# Patient Record
Sex: Female | Born: 2000 | Race: White | Hispanic: No | Marital: Single | Smoking: Never smoker
Health system: Southern US, Community
[De-identification: ages and names within clinical notes are randomized; demographics above are authoritative.]

## PROBLEM LIST (undated history)

## (undated) DIAGNOSIS — K219 Gastro-esophageal reflux disease without esophagitis: Secondary | ICD-10-CM

## (undated) DIAGNOSIS — F419 Anxiety disorder, unspecified: Secondary | ICD-10-CM

## (undated) DIAGNOSIS — K625 Hemorrhage of anus and rectum: Secondary | ICD-10-CM

## (undated) DIAGNOSIS — Z8669 Personal history of other diseases of the nervous system and sense organs: Secondary | ICD-10-CM

## (undated) DIAGNOSIS — I10 Essential (primary) hypertension: Secondary | ICD-10-CM

## (undated) DIAGNOSIS — R110 Nausea: Secondary | ICD-10-CM

## (undated) DIAGNOSIS — R450 Nervousness: Secondary | ICD-10-CM

## (undated) DIAGNOSIS — F429 Obsessive-compulsive disorder, unspecified: Secondary | ICD-10-CM

## (undated) DIAGNOSIS — R42 Dizziness and giddiness: Secondary | ICD-10-CM

## (undated) HISTORY — DX: Essential (primary) hypertension: I10

## (undated) HISTORY — DX: Hemorrhage of anus and rectum: K62.5

## (undated) HISTORY — DX: Gastro-esophageal reflux disease without esophagitis: K21.9

## (undated) HISTORY — DX: Obsessive-compulsive disorder, unspecified: F42.9

## (undated) HISTORY — DX: Dizziness and giddiness: R42

## (undated) HISTORY — DX: Nervousness: R45.0

## (undated) HISTORY — PX: WISDOM TOOTH EXTRACTION: SHX21

## (undated) HISTORY — PX: TARSAL COALITION EXCISION: SUR968

## (undated) HISTORY — DX: Anxiety disorder, unspecified: F41.9

## (undated) HISTORY — DX: Nausea: R11.0

---
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
## 2019-02-27 DIAGNOSIS — G8918 Other acute postprocedural pain: Secondary | ICD-10-CM | POA: Diagnosis not present

## 2019-02-27 DIAGNOSIS — M25571 Pain in right ankle and joints of right foot: Secondary | ICD-10-CM | POA: Diagnosis not present

## 2019-02-27 DIAGNOSIS — Q6689 Other  specified congenital deformities of feet: Secondary | ICD-10-CM | POA: Diagnosis not present

## 2019-09-25 DIAGNOSIS — Z01419 Encounter for gynecological examination (general) (routine) without abnormal findings: Secondary | ICD-10-CM | POA: Diagnosis not present

## 2019-09-25 DIAGNOSIS — N760 Acute vaginitis: Secondary | ICD-10-CM | POA: Diagnosis not present

## 2019-09-25 DIAGNOSIS — Z6821 Body mass index (BMI) 21.0-21.9, adult: Secondary | ICD-10-CM | POA: Diagnosis not present

## 2019-09-27 DIAGNOSIS — Z111 Encounter for screening for respiratory tuberculosis: Secondary | ICD-10-CM | POA: Diagnosis not present

## 2019-09-27 DIAGNOSIS — R030 Elevated blood-pressure reading, without diagnosis of hypertension: Secondary | ICD-10-CM | POA: Diagnosis not present

## 2019-09-27 DIAGNOSIS — Z23 Encounter for immunization: Secondary | ICD-10-CM | POA: Diagnosis not present

## 2019-09-27 DIAGNOSIS — R319 Hematuria, unspecified: Secondary | ICD-10-CM | POA: Diagnosis not present

## 2019-09-27 DIAGNOSIS — Z68.41 Body mass index (BMI) pediatric, 5th percentile to less than 85th percentile for age: Secondary | ICD-10-CM | POA: Diagnosis not present

## 2019-09-27 DIAGNOSIS — Z1331 Encounter for screening for depression: Secondary | ICD-10-CM | POA: Diagnosis not present

## 2019-09-27 DIAGNOSIS — Z0000 Encounter for general adult medical examination without abnormal findings: Secondary | ICD-10-CM | POA: Diagnosis not present

## 2019-09-27 DIAGNOSIS — Z713 Dietary counseling and surveillance: Secondary | ICD-10-CM | POA: Diagnosis not present

## 2019-10-10 DIAGNOSIS — R319 Hematuria, unspecified: Secondary | ICD-10-CM | POA: Diagnosis not present

## 2019-10-10 DIAGNOSIS — R300 Dysuria: Secondary | ICD-10-CM | POA: Diagnosis not present

## 2019-10-16 DIAGNOSIS — R799 Abnormal finding of blood chemistry, unspecified: Secondary | ICD-10-CM | POA: Diagnosis not present

## 2019-10-16 DIAGNOSIS — R319 Hematuria, unspecified: Secondary | ICD-10-CM | POA: Diagnosis not present

## 2019-10-24 DIAGNOSIS — Z20828 Contact with and (suspected) exposure to other viral communicable diseases: Secondary | ICD-10-CM | POA: Diagnosis not present

## 2019-10-24 DIAGNOSIS — Z03818 Encounter for observation for suspected exposure to other biological agents ruled out: Secondary | ICD-10-CM | POA: Diagnosis not present

## 2019-11-15 DIAGNOSIS — Z23 Encounter for immunization: Secondary | ICD-10-CM | POA: Diagnosis not present

## 2019-12-06 DIAGNOSIS — Z23 Encounter for immunization: Secondary | ICD-10-CM | POA: Diagnosis not present

## 2020-06-13 ENCOUNTER — Ambulatory Visit: Payer: BC Managed Care – PPO | Admitting: Podiatry

## 2020-06-13 ENCOUNTER — Encounter: Payer: Self-pay | Admitting: Podiatry

## 2020-06-13 ENCOUNTER — Other Ambulatory Visit: Payer: Self-pay

## 2020-06-13 DIAGNOSIS — B070 Plantar wart: Secondary | ICD-10-CM

## 2020-06-16 NOTE — Progress Notes (Signed)
Subjective:   Patient ID: Kaitlin Terry, female   DOB: 19 y.o.   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  States that it seemed to do well before and she does not want anything cut out currently and presents with mother   Review of Systems  All other systems reviewed and are negative.       Objective:  Physical Exam Vitals and nursing note reviewed.  Constitutional:      Appearance: She is well-developed.  Pulmonary:     Effort: Pulmonary effort is normal.  Musculoskeletal:        General: Normal range of motion.  Skin:    General: Skin is warm.  Neurological:     Mental Status: She is alert.     Neurovascular status intact muscle strength found to be adequate range of motion within normal limits.  Patient is found to have a small mass on the plantar medial right heel that is painful when palpated and makes walking difficult at times.  It is localized to this area and measures about 8 mm x 1 cm     Assessment:  Verruca plantaris plantar aspect right     Plan:  H&P condition reviewed and today went ahead using sharp sterile instrumentation and lesion I then found a small area of verrucous tissue I applied chemical agent response with sterile dressing and patient will leave this on for 1 day and I explained what to do if any blistering or other pathology were to occur.  Reappoint as needed

## 2020-09-05 DIAGNOSIS — R45851 Suicidal ideations: Secondary | ICD-10-CM | POA: Diagnosis not present

## 2020-09-05 DIAGNOSIS — K219 Gastro-esophageal reflux disease without esophagitis: Secondary | ICD-10-CM | POA: Diagnosis not present

## 2020-09-05 DIAGNOSIS — Z6821 Body mass index (BMI) 21.0-21.9, adult: Secondary | ICD-10-CM | POA: Diagnosis not present

## 2020-09-05 DIAGNOSIS — R452 Unhappiness: Secondary | ICD-10-CM | POA: Diagnosis not present

## 2020-09-05 DIAGNOSIS — Z1331 Encounter for screening for depression: Secondary | ICD-10-CM | POA: Diagnosis not present

## 2020-09-05 DIAGNOSIS — R450 Nervousness: Secondary | ICD-10-CM | POA: Diagnosis not present

## 2020-09-05 DIAGNOSIS — Z23 Encounter for immunization: Secondary | ICD-10-CM | POA: Diagnosis not present

## 2020-09-05 DIAGNOSIS — R454 Irritability and anger: Secondary | ICD-10-CM | POA: Diagnosis not present

## 2020-10-21 DIAGNOSIS — R112 Nausea with vomiting, unspecified: Secondary | ICD-10-CM | POA: Diagnosis not present

## 2020-10-21 DIAGNOSIS — R6881 Early satiety: Secondary | ICD-10-CM | POA: Diagnosis not present

## 2020-10-21 DIAGNOSIS — R1310 Dysphagia, unspecified: Secondary | ICD-10-CM | POA: Diagnosis not present

## 2020-10-21 DIAGNOSIS — K219 Gastro-esophageal reflux disease without esophagitis: Secondary | ICD-10-CM | POA: Diagnosis not present

## 2020-11-10 DIAGNOSIS — Z1331 Encounter for screening for depression: Secondary | ICD-10-CM | POA: Diagnosis not present

## 2020-11-10 DIAGNOSIS — Z113 Encounter for screening for infections with a predominantly sexual mode of transmission: Secondary | ICD-10-CM | POA: Diagnosis not present

## 2020-11-10 DIAGNOSIS — R030 Elevated blood-pressure reading, without diagnosis of hypertension: Secondary | ICD-10-CM | POA: Diagnosis not present

## 2020-11-10 DIAGNOSIS — Z0000 Encounter for general adult medical examination without abnormal findings: Secondary | ICD-10-CM | POA: Diagnosis not present

## 2020-11-13 DIAGNOSIS — R030 Elevated blood-pressure reading, without diagnosis of hypertension: Secondary | ICD-10-CM | POA: Diagnosis not present

## 2020-11-14 DIAGNOSIS — I498 Other specified cardiac arrhythmias: Secondary | ICD-10-CM | POA: Diagnosis not present

## 2020-12-01 DIAGNOSIS — Z3040 Encounter for surveillance of contraceptives, unspecified: Secondary | ICD-10-CM | POA: Diagnosis not present

## 2020-12-01 DIAGNOSIS — R030 Elevated blood-pressure reading, without diagnosis of hypertension: Secondary | ICD-10-CM | POA: Diagnosis not present

## 2020-12-01 DIAGNOSIS — Z3041 Encounter for surveillance of contraceptive pills: Secondary | ICD-10-CM | POA: Diagnosis not present

## 2020-12-22 DIAGNOSIS — R112 Nausea with vomiting, unspecified: Secondary | ICD-10-CM | POA: Diagnosis not present

## 2020-12-22 DIAGNOSIS — R6881 Early satiety: Secondary | ICD-10-CM | POA: Diagnosis not present

## 2020-12-22 DIAGNOSIS — K3189 Other diseases of stomach and duodenum: Secondary | ICD-10-CM | POA: Diagnosis not present

## 2020-12-22 DIAGNOSIS — K2289 Other specified disease of esophagus: Secondary | ICD-10-CM | POA: Diagnosis not present

## 2020-12-22 DIAGNOSIS — R1310 Dysphagia, unspecified: Secondary | ICD-10-CM | POA: Diagnosis not present

## 2020-12-22 DIAGNOSIS — K2930 Chronic superficial gastritis without bleeding: Secondary | ICD-10-CM | POA: Diagnosis not present

## 2021-01-19 DIAGNOSIS — Z01419 Encounter for gynecological examination (general) (routine) without abnormal findings: Secondary | ICD-10-CM | POA: Diagnosis not present

## 2021-01-19 DIAGNOSIS — Z6822 Body mass index (BMI) 22.0-22.9, adult: Secondary | ICD-10-CM | POA: Diagnosis not present

## 2021-03-18 DIAGNOSIS — K625 Hemorrhage of anus and rectum: Secondary | ICD-10-CM | POA: Diagnosis not present

## 2021-03-18 DIAGNOSIS — K30 Functional dyspepsia: Secondary | ICD-10-CM | POA: Diagnosis not present

## 2021-03-18 DIAGNOSIS — Z8379 Family history of other diseases of the digestive system: Secondary | ICD-10-CM | POA: Diagnosis not present

## 2021-03-18 DIAGNOSIS — K219 Gastro-esophageal reflux disease without esophagitis: Secondary | ICD-10-CM | POA: Diagnosis not present

## 2021-03-24 DIAGNOSIS — K625 Hemorrhage of anus and rectum: Secondary | ICD-10-CM | POA: Diagnosis not present

## 2021-05-08 DIAGNOSIS — F419 Anxiety disorder, unspecified: Secondary | ICD-10-CM | POA: Diagnosis not present

## 2021-05-08 DIAGNOSIS — Z23 Encounter for immunization: Secondary | ICD-10-CM | POA: Diagnosis not present

## 2021-05-08 DIAGNOSIS — R450 Nervousness: Secondary | ICD-10-CM | POA: Diagnosis not present

## 2021-05-08 DIAGNOSIS — Z1331 Encounter for screening for depression: Secondary | ICD-10-CM | POA: Diagnosis not present

## 2021-05-14 ENCOUNTER — Other Ambulatory Visit: Payer: Self-pay | Admitting: Gastroenterology

## 2021-05-14 DIAGNOSIS — R1013 Epigastric pain: Secondary | ICD-10-CM

## 2021-05-14 DIAGNOSIS — K582 Mixed irritable bowel syndrome: Secondary | ICD-10-CM | POA: Diagnosis not present

## 2021-05-14 DIAGNOSIS — R110 Nausea: Secondary | ICD-10-CM

## 2021-05-26 ENCOUNTER — Ambulatory Visit
Admission: RE | Admit: 2021-05-26 | Discharge: 2021-05-26 | Disposition: A | Discharge status: Home / Self Care | Payer: BC Managed Care – PPO | Source: Ambulatory Visit | Attending: Gastroenterology | Admitting: Gastroenterology

## 2021-05-26 DIAGNOSIS — R110 Nausea: Secondary | ICD-10-CM | POA: Diagnosis not present

## 2021-05-26 DIAGNOSIS — R1013 Epigastric pain: Secondary | ICD-10-CM

## 2022-02-15 DIAGNOSIS — Z79899 Other long term (current) drug therapy: Secondary | ICD-10-CM | POA: Diagnosis not present

## 2022-02-15 DIAGNOSIS — R42 Dizziness and giddiness: Secondary | ICD-10-CM | POA: Diagnosis not present

## 2022-02-15 DIAGNOSIS — F419 Anxiety disorder, unspecified: Secondary | ICD-10-CM | POA: Diagnosis not present

## 2022-03-05 DIAGNOSIS — Z131 Encounter for screening for diabetes mellitus: Secondary | ICD-10-CM | POA: Diagnosis not present

## 2022-03-05 DIAGNOSIS — Z13220 Encounter for screening for lipoid disorders: Secondary | ICD-10-CM | POA: Diagnosis not present

## 2022-03-05 DIAGNOSIS — Z79899 Other long term (current) drug therapy: Secondary | ICD-10-CM | POA: Diagnosis not present

## 2022-03-05 DIAGNOSIS — R42 Dizziness and giddiness: Secondary | ICD-10-CM | POA: Diagnosis not present

## 2022-06-16 DIAGNOSIS — Z01419 Encounter for gynecological examination (general) (routine) without abnormal findings: Secondary | ICD-10-CM | POA: Diagnosis not present

## 2022-06-16 DIAGNOSIS — N760 Acute vaginitis: Secondary | ICD-10-CM | POA: Diagnosis not present

## 2022-06-16 DIAGNOSIS — Z6824 Body mass index (BMI) 24.0-24.9, adult: Secondary | ICD-10-CM | POA: Diagnosis not present

## 2022-06-16 DIAGNOSIS — Z124 Encounter for screening for malignant neoplasm of cervix: Secondary | ICD-10-CM | POA: Diagnosis not present

## 2022-06-16 DIAGNOSIS — Z1151 Encounter for screening for human papillomavirus (HPV): Secondary | ICD-10-CM | POA: Diagnosis not present

## 2022-07-10 ENCOUNTER — Other Ambulatory Visit: Payer: Self-pay

## 2022-07-10 ENCOUNTER — Emergency Department (HOSPITAL_BASED_OUTPATIENT_CLINIC_OR_DEPARTMENT_OTHER)
Admission: EM | Admit: 2022-07-10 | Discharge: 2022-07-11 | Disposition: A | Discharge status: Home / Self Care | Payer: BC Managed Care – PPO | Attending: Emergency Medicine | Admitting: Emergency Medicine

## 2022-07-10 DIAGNOSIS — Z23 Encounter for immunization: Secondary | ICD-10-CM | POA: Insufficient documentation

## 2022-07-10 DIAGNOSIS — S61352A Open bite of right middle finger with damage to nail, initial encounter: Secondary | ICD-10-CM | POA: Insufficient documentation

## 2022-07-10 DIAGNOSIS — W540XXA Bitten by dog, initial encounter: Secondary | ICD-10-CM | POA: Insufficient documentation

## 2022-07-10 MED ORDER — TETANUS-DIPHTH-ACELL PERTUSSIS 5-2.5-18.5 LF-MCG/0.5 IM SUSY
0.5000 mL | PREFILLED_SYRINGE | Freq: Once | INTRAMUSCULAR | Status: AC
  Administered 2022-07-10: 0.5 mL via INTRAMUSCULAR
  Filled 2022-07-10: qty 0.5

## 2022-07-10 MED ORDER — OXYCODONE-ACETAMINOPHEN 5-325 MG PO TABS
1.0000 | ORAL_TABLET | ORAL | Status: AC | PRN
  Administered 2022-07-10 (×2): 1 via ORAL
  Filled 2022-07-10 (×2): qty 1

## 2022-07-10 MED ORDER — AMOXICILLIN-POT CLAVULANATE 875-125 MG PO TABS
1.0000 | ORAL_TABLET | Freq: Once | ORAL | Status: AC
  Administered 2022-07-10: 1 via ORAL
  Filled 2022-07-10: qty 1

## 2022-07-10 NOTE — ED Notes (Signed)
Pt is soaking Betadine and saline solution.

## 2022-07-10 NOTE — ED Triage Notes (Signed)
Patient arrives with complaints of a dog bite to right hand. Patient was trying to take food out of her dogs mouth when the dog bit her finger. (Right hand, middle finger). Rates pain an 8/10.  Patient states that it was her dog and their immunizations are up to date.

## 2022-07-11 ENCOUNTER — Emergency Department (HOSPITAL_BASED_OUTPATIENT_CLINIC_OR_DEPARTMENT_OTHER): Payer: BC Managed Care – PPO

## 2022-07-11 DIAGNOSIS — S61451A Open bite of right hand, initial encounter: Secondary | ICD-10-CM | POA: Diagnosis not present

## 2022-07-11 MED ORDER — AMOXICILLIN-POT CLAVULANATE 875-125 MG PO TABS: 1.0000 | ORAL_TABLET | Freq: Two times a day (BID) | ORAL | 0 refills | Status: DC

## 2022-07-11 NOTE — ED Provider Notes (Signed)
[facility] EMERGENCY DEPT Provider Note   CSN: [id number] Arrival date & time: 07/10/22  2049     History  Chief Complaint  Patient presents with   Animal Bite   Finger Injury    Right Hand    Kaitlin Terry is a 21 y.o. female.  The history is provided by the patient.  Animal Bite Contact animal:  Dog Location:  Shoulder/arm Shoulder/arm injury location:  R fingers (R middle finger, through the nail and to the tuft) Time since incident:  4 hours Pain details:    Quality:  Aching   Severity:  Moderate   Timing:  Constant   Progression:  Unchanged Incident location:  Home Provoked: provoked (taking a chicken bone away)   Animal's rabies vaccination status:  Up to date Animal in possession: yes   Tetanus status:  Unknown Relieved by:  Nothing Worsened by:  Nothing Ineffective treatments:  None tried Associated symptoms: no fever        Home Medications Prior to Admission medications   Medication Sig Start Date End Date Taking? Authorizing Provider  amoxicillin-clavulanate (AUGMENTIN) 875-125 MG tablet Take 1 tablet by mouth every 12 (twelve) hours. 07/11/22  Yes Demico Ploch, MD  LO LOESTRIN FE 1 MG-10 MCG / 10 MCG tablet TAKE 1 TABLET DAILY AS DIRECTED. 03/23/16   [provider]      Allergies    Other    Review of Systems   Review of Systems  Constitutional:  Negative for fever.  HENT:  Positive for voice change.   Respiratory:  Negative for wheezing and stridor.   Cardiovascular:  Negative for chest pain.  Musculoskeletal:  Positive for arthralgias.  Skin:  Positive for wound.  Psychiatric/Behavioral:  Negative for agitation.   All other systems reviewed and are negative.   Physical Exam Updated Vital Signs BP (!) 137/108   Pulse 89   Temp 98 F (36.7 C) (Oral)   Resp 18   Ht 5\' 6"  (1.676 m)   Wt 68 kg   LMP 06/28/2022   SpO2 98%   BMI 24.21 kg/m  Physical Exam Vitals and nursing note reviewed.   Constitutional:      General: She is not in acute distress.    Appearance: Normal appearance. She is well-developed.  HENT:     Head: Normocephalic and atraumatic.     Nose: Nose normal.  Eyes:     Pupils: Pupils are equal, round, and reactive to light.  Cardiovascular:     Rate and Rhythm: Normal rate and regular rhythm.     Pulses: Normal pulses.     Heart sounds: Normal heart sounds.  Pulmonary:     Effort: Pulmonary effort is normal. No respiratory distress.     Breath sounds: Normal breath sounds.  Abdominal:     General: Bowel sounds are normal. There is no distension.     Palpations: Abdomen is soft.     Tenderness: There is no abdominal tenderness. There is no guarding or rebound.  Genitourinary:    Vagina: No vaginal discharge.  Musculoskeletal:        General: Normal range of motion.       Arms:     Cervical back: Neck supple.  Skin:    General: Skin is dry.     Capillary Refill: Capillary refill takes less than 2 seconds.     Findings: No erythema or rash.  Neurological:     General: No focal deficit present.  Mental Status: She is alert.     Deep Tendon Reflexes: Reflexes normal.  Psychiatric:        Mood and Affect: Mood normal.     ED Results / Procedures / Treatments   Labs (all labs ordered are listed, but only abnormal results are displayed) Labs Reviewed - No data to display  EKG None  Radiology DG Hand Complete Right  Result Date: 07/11/2022 CLINICAL DATA:  Dog bite to right hand EXAM: RIGHT HAND - COMPLETE 3+ VIEW COMPARISON:  None Available. FINDINGS: There is no evidence of fracture or dislocation. There is no evidence of arthropathy or other focal bone abnormality. Soft tissues are unremarkable. IMPRESSION: Negative. Electronically Signed   By: Rolm Baptise M.D.   On: 07/11/2022 00:55    Procedures Procedures    Medications Ordered in ED Medications  oxyCODONE-acetaminophen (PERCOCET/ROXICET) 5-325 MG per tablet 1 tablet (1 tablet  Oral Given 07/10/22 2345)  amoxicillin-clavulanate (AUGMENTIN) 875-125 MG per tablet 1 tablet (1 tablet Oral Given 07/10/22 2345)  Tdap (BOOSTRIX) injection 0.5 mL (0.5 mLs Intramuscular Given 07/10/22 2345)    ED Course/ Medical Decision Making/ A&P                           Medical Decision Making Patient taking chicken bone away from her dog bit through the nail and tuft of R middle finger   Amount and/or Complexity of Data Reviewed Independent Historian: parent    Details: See above  External Data Reviewed: notes.    Details: Previous notes reviewed  Radiology: ordered and independent interpretation performed.    Details: No fracture  Discussion of management or test interpretation with external provider(s): Case d/w Dr. Tempie Donning, call Monday am agrees with the plan of care.    Risk Prescription drug management. Risk Details: DIAL SOAKS THREE TIMES DAILY.  nO SUBMERGING IN ANY BODY OF WATER.  CALL MONDAY TO BE SEEN BY HAND SURGERY.      Final Clinical Impression(s) / ED Diagnoses Final diagnoses:  Dog bite, initial encounter   Return for intractable cough, coughing up blood, fevers > 100.4 unrelieved by medication, shortness of breath, intractable vomiting, chest pain, shortness of breath, weakness, numbness, changes in speech, facial asymmetry, abdominal pain, passing out, Inability to tolerate liquids or food, cough, altered mental status or any concerns. No signs of systemic illness or infection. The patient is nontoxic-appearing on exam and vital signs are within normal limits.  I have reviewed the triage vital signs and the nursing notes. Pertinent labs & imaging results that were available during my care of the patient were reviewed by me and considered in my medical decision making (see chart for details). After history, exam, and medical workup I feel the patient has been appropriately medically screened and is safe for discharge home. Pertinent diagnoses were discussed with  the patient. Patient was given return precautions.  Rx / DC Orders ED Discharge Orders          Ordered    amoxicillin-clavulanate (AUGMENTIN) 875-125 MG tablet  Every 12 hours        07/11/22 0056              Demetrius Barrell, MD 07/11/22 0121

## 2022-10-26 DIAGNOSIS — F419 Anxiety disorder, unspecified: Secondary | ICD-10-CM | POA: Diagnosis not present

## 2022-10-26 DIAGNOSIS — F429 Obsessive-compulsive disorder, unspecified: Secondary | ICD-10-CM | POA: Diagnosis not present

## 2022-12-14 DIAGNOSIS — F419 Anxiety disorder, unspecified: Secondary | ICD-10-CM | POA: Diagnosis not present

## 2023-01-31 DIAGNOSIS — Z8379 Family history of other diseases of the digestive system: Secondary | ICD-10-CM | POA: Diagnosis not present

## 2023-01-31 DIAGNOSIS — K219 Gastro-esophageal reflux disease without esophagitis: Secondary | ICD-10-CM | POA: Diagnosis not present

## 2023-01-31 DIAGNOSIS — R030 Elevated blood-pressure reading, without diagnosis of hypertension: Secondary | ICD-10-CM | POA: Diagnosis not present

## 2023-02-22 DIAGNOSIS — Z0000 Encounter for general adult medical examination without abnormal findings: Secondary | ICD-10-CM | POA: Diagnosis not present

## 2023-05-22 IMAGING — US US ABDOMEN LIMITED
1 series · 14 of 25 positions shown · non-contrast
Comparison: None.

CLINICAL DATA: Nausea and epigastric pain.  Symptoms for 2 years.

EXAM:
ULTRASOUND ABDOMEN LIMITED RIGHT UPPER QUADRANT

[Series 1: us abdomen limited · 0.17mm/px · 14 of 45 slices shown]
[im 1/45]
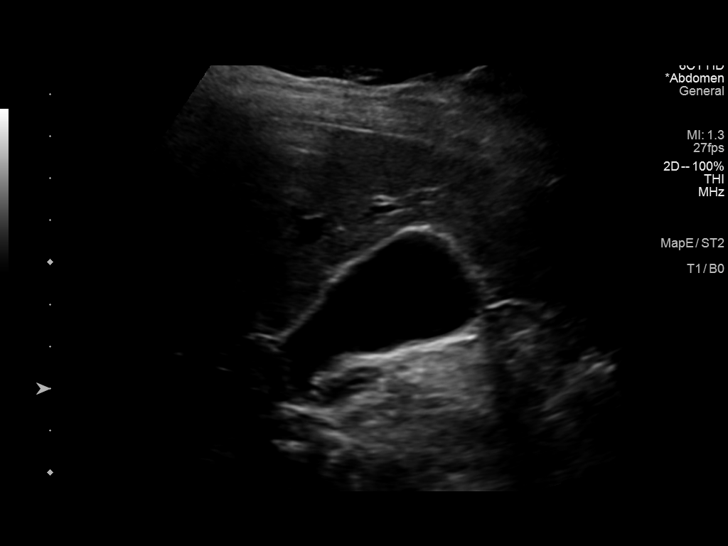
[im 4/45]
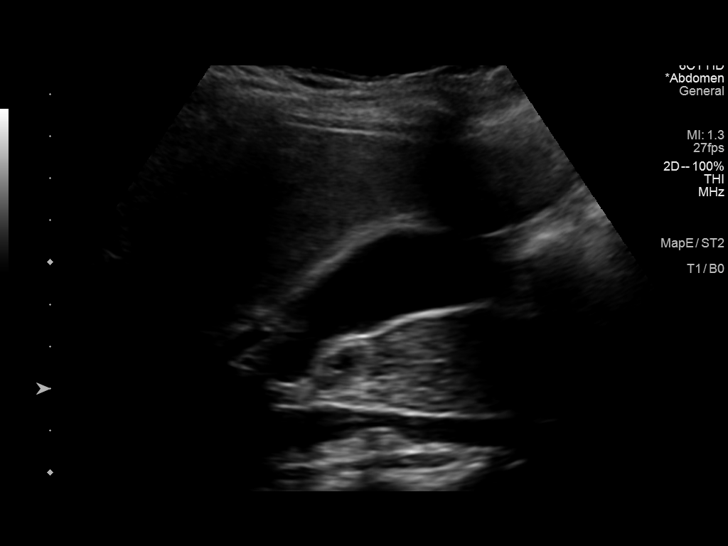
[im 8/45]
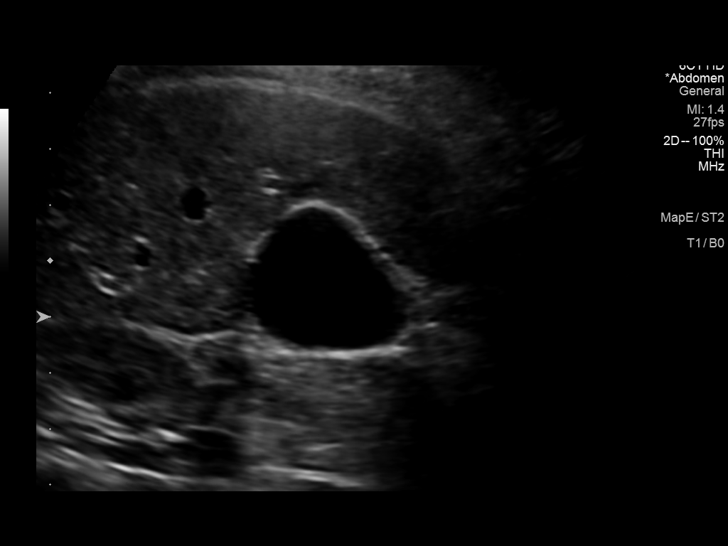
[im 12/45]
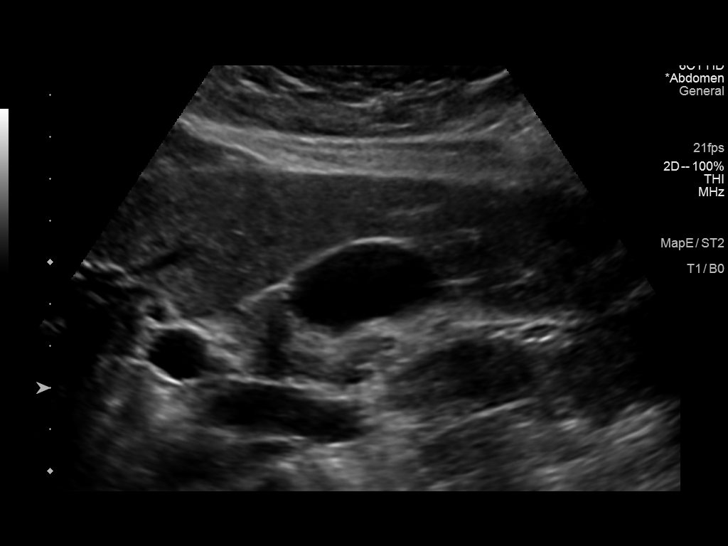
[im 15/45]
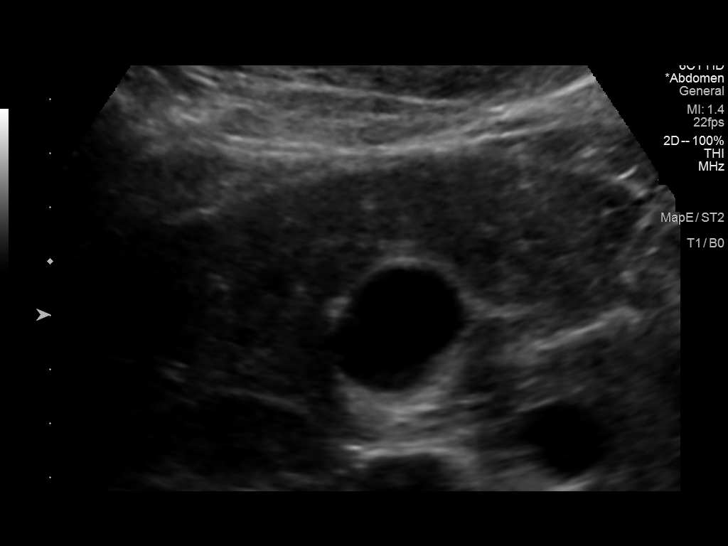
[im 17/45]
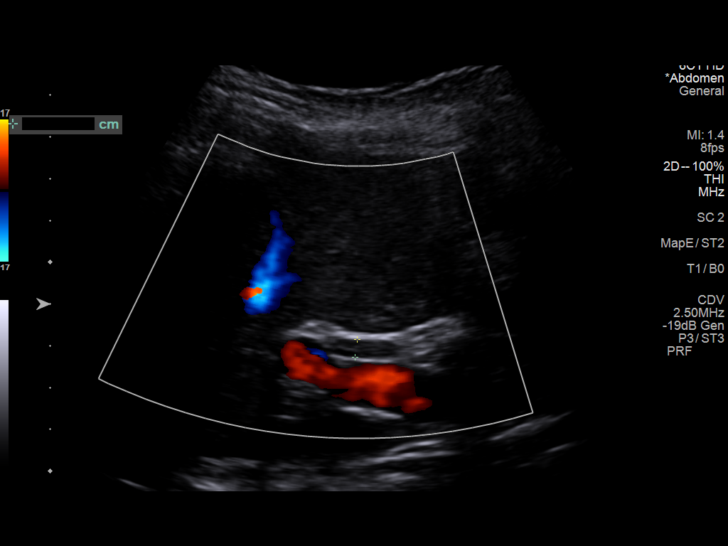
[im 21/45]
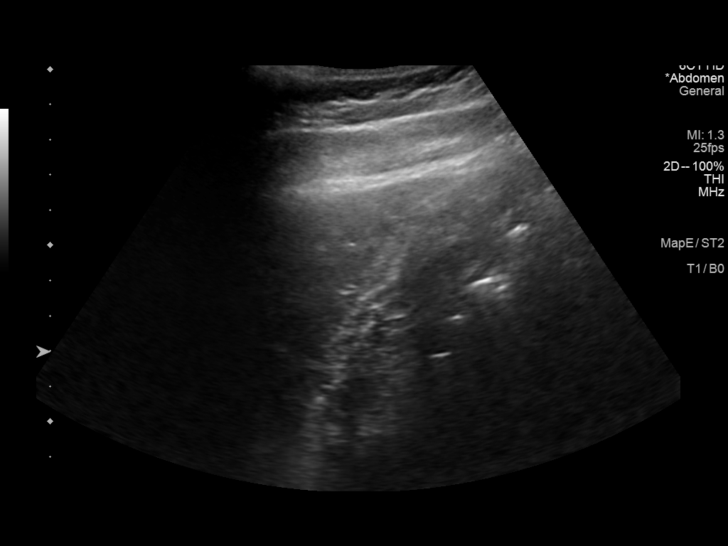
[im 24/45]
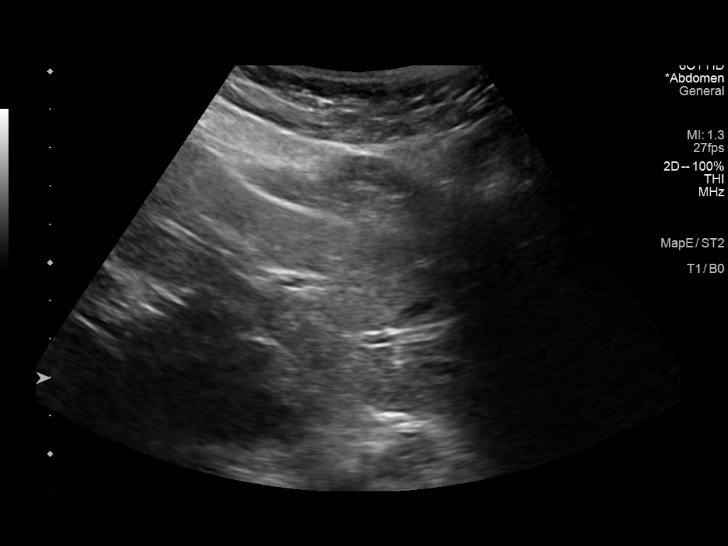
[im 28/45]
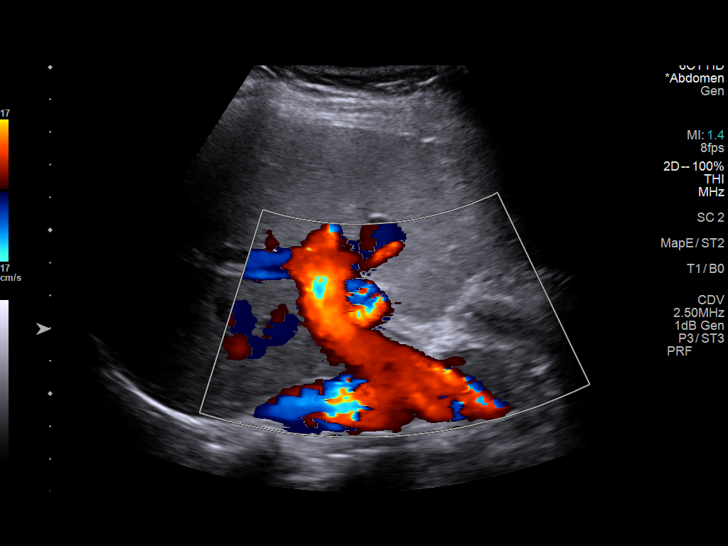
[im 30/45]
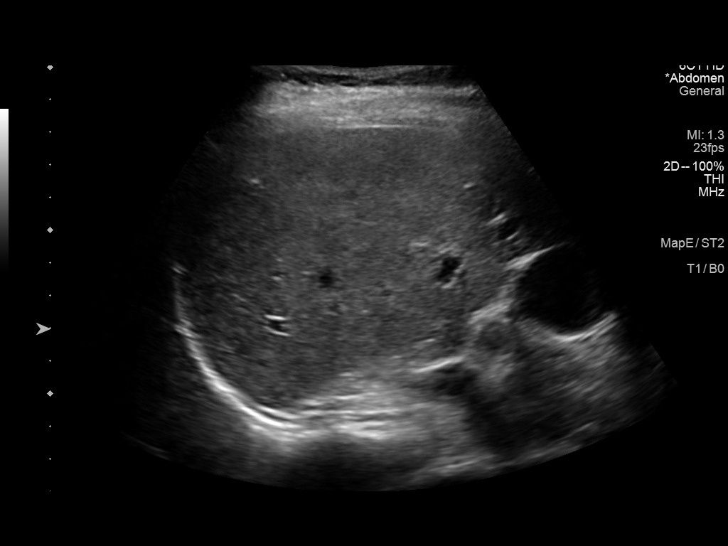
[im 34/45]
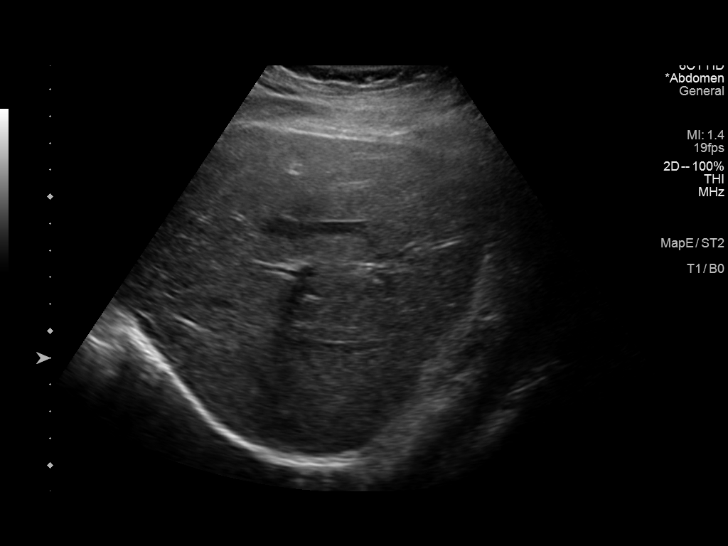
[im 37/45]
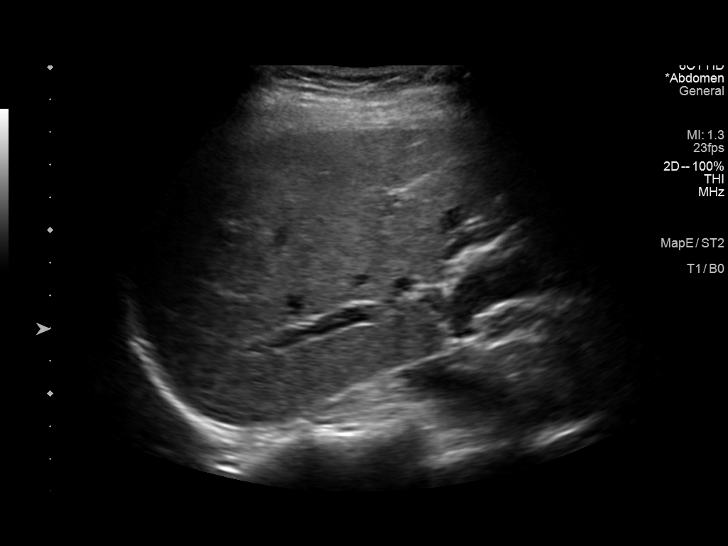
[im 41/45]
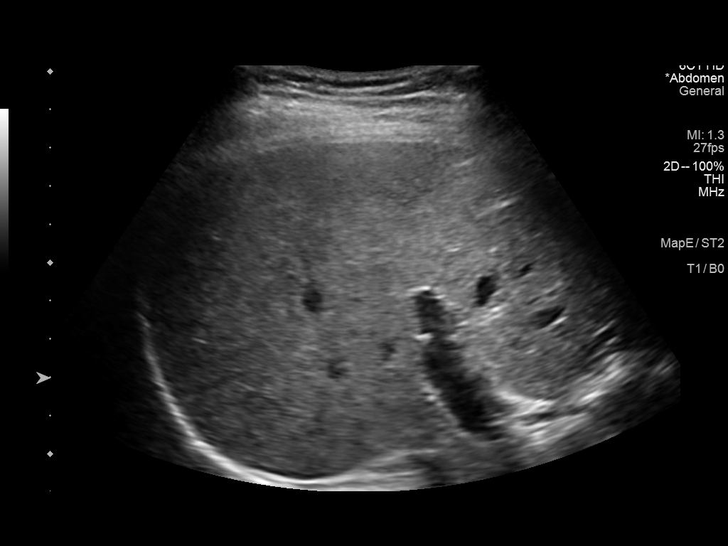
[im 45/45]
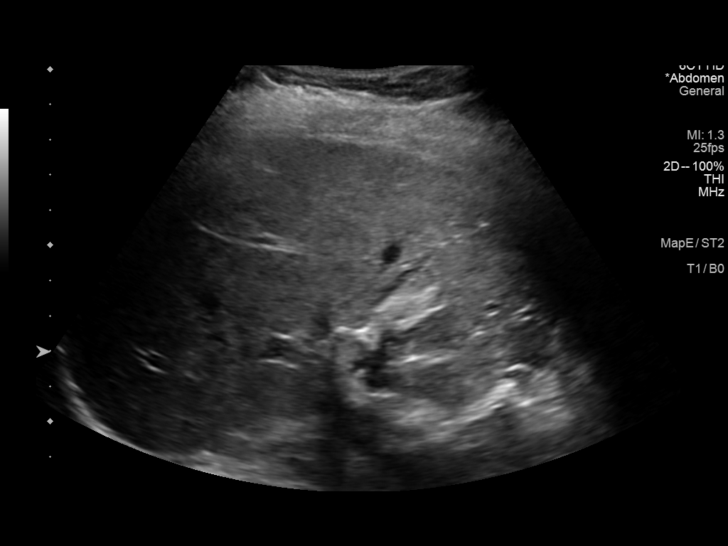

[14 of 25 positions shown; findings below may reference images not displayed]

FINDINGS: Gallbladder:

Physiologically distended. No gallstones or wall thickening
visualized. No sonographic Murphy sign noted by sonographer.

Common bile duct:

Diameter: 4 mm, normal.

Liver:

No focal lesion identified. Within normal limits in parenchymal
echogenicity. Portal vein is patent on color Doppler imaging with
normal direction of blood flow towards the liver.

Other: No right upper quadrant ascites.
IMPRESSION: Normal sonographic appearance of the gallbladder, liver, and biliary
tree.

## 2023-07-18 DIAGNOSIS — Z6825 Body mass index (BMI) 25.0-25.9, adult: Secondary | ICD-10-CM | POA: Diagnosis not present

## 2023-07-18 DIAGNOSIS — Z01419 Encounter for gynecological examination (general) (routine) without abnormal findings: Secondary | ICD-10-CM | POA: Diagnosis not present

## 2023-09-26 DIAGNOSIS — L309 Dermatitis, unspecified: Secondary | ICD-10-CM | POA: Diagnosis not present

## 2023-09-26 DIAGNOSIS — I10 Essential (primary) hypertension: Secondary | ICD-10-CM | POA: Diagnosis not present

## 2023-10-24 DIAGNOSIS — R809 Proteinuria, unspecified: Secondary | ICD-10-CM | POA: Diagnosis not present

## 2023-10-25 DIAGNOSIS — H9193 Unspecified hearing loss, bilateral: Secondary | ICD-10-CM | POA: Diagnosis not present

## 2023-10-25 DIAGNOSIS — H7403 Tympanosclerosis, bilateral: Secondary | ICD-10-CM | POA: Diagnosis not present

## 2023-10-25 DIAGNOSIS — H93291 Other abnormal auditory perceptions, right ear: Secondary | ICD-10-CM | POA: Diagnosis not present

## 2023-12-16 ENCOUNTER — Encounter (HOSPITAL_BASED_OUTPATIENT_CLINIC_OR_DEPARTMENT_OTHER): Payer: Self-pay | Admitting: Cardiology

## 2023-12-16 ENCOUNTER — Ambulatory Visit (HOSPITAL_BASED_OUTPATIENT_CLINIC_OR_DEPARTMENT_OTHER): Payer: BC Managed Care – PPO | Admitting: Cardiology

## 2023-12-16 VITALS — BP 148/100 | HR 104 | Ht 66.0 in | Wt 162.3 lb

## 2023-12-16 DIAGNOSIS — I10 Essential (primary) hypertension: Secondary | ICD-10-CM | POA: Diagnosis not present

## 2023-12-16 DIAGNOSIS — Z8249 Family history of ischemic heart disease and other diseases of the circulatory system: Secondary | ICD-10-CM

## 2023-12-16 DIAGNOSIS — Z7189 Other specified counseling: Secondary | ICD-10-CM | POA: Diagnosis not present

## 2023-12-16 MED ORDER — AMLODIPINE BESYLATE 5 MG PO TABS: 5.0000 mg | ORAL_TABLET | Freq: Every day | ORAL | 3 refills | Status: DC

## 2023-12-16 NOTE — Patient Instructions (Addendum)
 Medication Instructions:  Your physician has recommended you make the following change in your medication:  Start with amlodipine 5 mg daily. Check blood pressure for about 2-3 weeks and send me some readings. If blood pressure average is not consistently <130/80, we will increase to 10 mg daily. Consider taking in the evening to be able to check BP in the morning.  how to check blood pressure:  -sit comfortably in a chair, feet uncrossed and flat on floor, for 5-10 minutes  -arm ideally should rest at the level of the heart. However, arm should be relaxed and not tense (for example, do not hold the arm up unsupported)  -avoid exercise, caffeine, and tobacco for at least 30 minutes prior to BP reading  -don't take BP cuff reading over clothes (always place on skin directly)  -I prefer to know how well the medication is working, so I would like you to take your readings 1-2 hours after taking your blood pressure medication if possible   *If you need a refill on your cardiac medications before your next appointment, please call your pharmacy*   Follow-Up: At [facility], you and your health needs are our priority.  As part of our continuing mission to provide you with exceptional heart care, we have created designated Provider Care Teams.  These Care Teams include your primary Cardiologist (physician) and Advanced Practice Providers (APPs -  Physician Assistants and Nurse Practitioners) who all work together to provide you with the care you need, when you need it.  We recommend signing up for the patient portal called "MyChart".  Sign up information is provided on this After Visit Summary.  MyChart is used to connect with patients for Virtual Visits (Telemedicine).  Patients are able to view lab/test results, encounter notes, upcoming appointments, etc.  Non-urgent messages can be sent to your provider as well.   To learn more about what you can do with MyChart, go to  [email or web address].    Your next appointment:   3 month(s)  Provider:   Gillian Shields, NP

## 2023-12-16 NOTE — Progress Notes (Signed)
 Cardiology Office Note:  .   Date:  12/16/2023  ID:  [name], DOB Aug 14, 2001, MRN [id number] PCP: Darrow Bussing, MD  [facility] Providers Cardiologist:  Jodelle Red, MD {  History of Present Illness: .   Kaitlin Terry is a 23 y.o. female without prior cardiac history, with history of OCD, seen today for evaluation of hypertension at the request of Dr. Chanetta Marshall.  Today: Referred by Dr. Chanetta Marshall on 09/27/23 for hypertension. Referral requested evaluation in advanced hypertension clinic with Dr. Duke Salvia; however, she was scheduled with me.  Review of note from 09/26/2023 with Dr. Chanetta Marshall notes BP of 144/101. She was not on antihypertensive at that time and is not currently on anything. Labs at that time noted normal renal panel, normal liver panel, normal CBC, normal TSH.  Here with her mother today. Previously saw Dr. Aleene Davidson through [facility] in 10/2020, was told she had white coat syndrome.  First told while she was in college that her blood pressure was elevated. Has never been on BP medication. Has been working on decreasing salt, increasing exercise, but this did not affect her BP significantly.   Checks her BP intermittently at home, BP readings are 130s-140s/90s-100s. Highest BP reading ~150/110.  No prior history of heart issues, even as a child. No tobacco, occasional 1-2 drinks every 2-3 weeks. She is active, walks regularly. Drinks a coffee every morning, then a soda once a day or every other day. No longer drinking energy drinks as frequently (none in 2 weeks).  Father has hypertension, diagnosed in his 30s. Mother has HTN (just diagnosed recently). Kaitlin Terry, pat gpa, pat aunt has HTN. Mat gpa has HTN.  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  Has previously had dizziness, now much better.   Reviewed causes of hypertension. She doesn't meet criteria for secondary hypertension workup yet as she is on no meds,  but we reviewed these risks in general, highlighted below.  Started OCPs 2016-2017 for menstrual symptoms. Now on zoloft as well--BP predates start of zoloft. Very rare NSAIDS, Has quit all alcohol and caffeine in the past with no effect on her BP.   Secondary Causes of Hypertension  Reviewed medications/Herbal: OCP, steroids, stimulants, antidepressants, weight loss medication, immune suppressants, NSAIDs, sympathomimetics, alcohol, caffeine, licorice, ginseng, St. John's wort, chemo  Reviewed comorbid conditions increased risk of HTN: Sleep Apnea: no symptoms Renal artery stenosis: no bruit Hyperaldosteronism: potassium normal Hyper/hypothyroidism: normal TSH Pheochromocytoma: No symptoms of palpitations, tachycardia, headache, diaphoresis (plasma metanephrines) Cushing's syndrome: No symptoms of cushingoid facies, central obesity, proximal muscle weakness, and ecchymoses, adrenal incidentaloma (cortisol) Coarctation of the aorta: no symptoms in LE, no bruit, equal pulses  ROS: Denies chest pain, shortness of breath at rest or with normal exertion. No PND, orthopnea, LE edema or unexpected weight gain. No syncope or palpitations. ROS otherwise negative except as noted.   Studies Reviewed: Kaitlin Terry Kitchen    EKG:  EKG Interpretation Date/Time:  Friday December 16 2023 16:04:14 EST Ventricular Rate:  72 PR Interval:  122 QRS Duration:  80 QT Interval:  364 QTC Calculation: 398 R Axis:   21  Text Interpretation: Normal sinus rhythm with sinus arrhythmia Normal ECG Confirmed by Jodelle Red [phone]) on 12/16/2023 4:12:17 PM    Physical Exam:   VS:  BP (!) 148/100   Pulse (!) 104   Ht 5\' 6"  (1.676 m)   Wt 162 lb 4.8 oz (73.6 kg)   SpO2 93%  BMI 26.20 kg/m    Wt Readings from Last 3 Encounters:  12/16/23 162 lb 4.8 oz (73.6 kg)  07/10/22 150 lb (68 kg)  05/02/17 143 lb 6.4 oz (65 kg) (81%, Z= 0.89)*   * Growth percentiles are based on CDC (Girls, 2-20 Years) data.    GEN: Well  nourished, well developed in no acute distress HEENT: Normal, moist mucous membranes NECK: No JVD CARDIAC: regular rhythm, normal S1 and S2, no rubs or gallops. No murmur. VASCULAR: Radial and DP pulses 2+ bilaterally. No carotid bruits RESPIRATORY:  Clear to auscultation without rales, wheezing or rhonchi  ABDOMEN: Soft, non-tender, non-distended MUSCULOSKELETAL:  Ambulates independently SKIN: Warm and dry, no edema NEUROLOGIC:  Alert and oriented x 3. No focal neuro deficits noted. PSYCHIATRIC:  Normal affect    ASSESSMENT AND PLAN: .    Hypertension -will start with amlodipine, start at 5 mg dose. She will send readings, may increase to 10 mg if not at goal. Discussed compression stockings given potential for LE edema and blood pooling -next would be ARB, thiazide, ir spironolactone -5th would be carvedilol or similar  Lipids -per [facility] 03/05/2022: Tchol 229, HDL 51, TG 130, LDL 155 -no indication for statin at this time -check at least every five years  CV risk counseling and prevention -recommend heart healthy/Mediterranean diet, with whole grains, fruits, vegetable, fish, lean meats, nuts, and olive oil. Limit salt. -recommend moderate walking, 3-5 times/week for 30-50 minutes each session. Aim for at least 150 minutes.week. Goal should be pace of 3 miles/hours, or walking 1.5 miles in 30 minutes -recommend avoidance of tobacco products. Avoid excess alcohol. -ASCVD risk score: The ASCVD Risk score (Arnett DK, et al., 2019) failed to calculate for the following reasons:   The 2019 ASCVD risk score is only valid for ages 45 to 46    Dispo: 3 months with Gillian Shields  Signed, Jodelle Red, MD   Jodelle Red, MD, PhD, [facility] [facility]  [facility]  [facility] [address], [address] [place], [place] [address] [phone]

## 2023-12-18 DIAGNOSIS — R55 Syncope and collapse: Secondary | ICD-10-CM | POA: Diagnosis not present

## 2023-12-18 DIAGNOSIS — S01511A Laceration without foreign body of lip, initial encounter: Secondary | ICD-10-CM | POA: Diagnosis not present

## 2023-12-18 DIAGNOSIS — K130 Diseases of lips: Secondary | ICD-10-CM | POA: Diagnosis not present

## 2024-02-17 ENCOUNTER — Ambulatory Visit (HOSPITAL_BASED_OUTPATIENT_CLINIC_OR_DEPARTMENT_OTHER): Payer: BC Managed Care – PPO | Admitting: Family

## 2024-06-21 ENCOUNTER — Other Ambulatory Visit: Payer: Self-pay | Admitting: Gastroenterology

## 2024-06-21 DIAGNOSIS — R197 Diarrhea, unspecified: Secondary | ICD-10-CM

## 2024-06-25 DIAGNOSIS — R197 Diarrhea, unspecified: Secondary | ICD-10-CM | POA: Diagnosis not present

## 2024-07-02 ENCOUNTER — Encounter: Payer: Self-pay | Admitting: Gastroenterology

## 2024-07-04 ENCOUNTER — Ambulatory Visit
Admission: RE | Admit: 2024-07-04 | Discharge: 2024-07-04 | Disposition: A | Discharge status: Home / Self Care | Source: Ambulatory Visit | Attending: Gastroenterology | Admitting: Gastroenterology

## 2024-07-04 DIAGNOSIS — N281 Cyst of kidney, acquired: Secondary | ICD-10-CM | POA: Diagnosis not present

## 2024-07-04 DIAGNOSIS — R197 Diarrhea, unspecified: Secondary | ICD-10-CM

## 2024-07-04 MED ORDER — IOPAMIDOL (ISOVUE-300) INJECTION 61%
100.0000 mL | Freq: Once | INTRAVENOUS | Status: AC | PRN
  Administered 2024-07-04: 100 mL via INTRAVENOUS

## 2024-07-06 ENCOUNTER — Ambulatory Visit (HOSPITAL_BASED_OUTPATIENT_CLINIC_OR_DEPARTMENT_OTHER): Admitting: Family

## 2024-07-06 ENCOUNTER — Encounter (HOSPITAL_BASED_OUTPATIENT_CLINIC_OR_DEPARTMENT_OTHER): Payer: Self-pay | Admitting: Family

## 2024-07-06 DIAGNOSIS — I10 Essential (primary) hypertension: Secondary | ICD-10-CM | POA: Diagnosis not present

## 2024-07-06 MED ORDER — AMLODIPINE BESYLATE 5 MG PO TABS: 5.0000 mg | ORAL_TABLET | Freq: Every day | ORAL | 3 refills | Status: AC

## 2024-07-06 NOTE — Patient Instructions (Signed)
Medication Instructions:  No changes *If you need a refill on your cardiac medications before your next appointment, please call your pharmacy*   Lab Work: none   Testing/Procedures: none   Follow-Up: As needed   

## 2024-07-06 NOTE — Progress Notes (Signed)
  Cardiology Office Note   Date:  07/06/2024  ID:  Kaitlin Terry, DOB 03/04/2001, MRN [id number] PCP: Regino Slater, MD  [facility] Providers Cardiologist:  Shelda Bruckner, MD     History of Present Illness Kaitlin Terry is a 23 y.o. female with history of OCD, hypertension.  Family history of hypertension in her father, mother, paternal grandmother, paternal grandfather, paternal aunt, maternal grandfather.  Paternal grandmother father had four-vessel CABG.  Established with Dr. Bruckner 12/16/2023 after referral from PCP with elevated blood pressure.  Lab work by PCP normal renal panel, liver panel, CBC, TSH.  Previously evaluated [facility] Joannie [facility] 10[facility] and was told she had whitecoat syndrome.  Previous efforts to decrease salt, increase exercise did not affect her BP significantly.  Home BP 130-140s/90-100s.  No tobacco use and occasional alcohol use with 1-2 drinks every 2 to 3 weeks.  1 coffee and soda per day.  She had been on OCP since 2016-2017 and elevated blood pressure predated the initiation of Zoloft.  She was started on amlodipine  5 mg daily.  She is seen today for follow up. Her home BPs have been 130s/80s. She denies chest pain, shortness of breath, lightheadedness/dizziness, edema. No fluttering or heart palpitations reported. She notes an isolated episode of syncope on the day she started amlodipine  after taking the medication without eating and with an energy drink. A second episode occurred while getting an ear piercing. No syncope without clear cause.   ROS: Please see the history of present illness.    All other systems reviewed and are negative.   Studies Reviewed          Risk Assessment/Calculations           Physical Exam VS:  BP 120/80   Pulse 97   Ht 5' 6 (1.676 m)   Wt 165 lb (74.8 kg)   SpO2 99%   BMI 26.63 kg/m        Wt Readings from Last 3 Encounters:  07/06/24 165 lb (74.8 kg)  12/16/23 162 lb  4.8 oz (73.6 kg)  07/10/22 150 lb (68 kg)    GEN: Well nourished, well developed in no acute distress NECK: No JVD; No carotid bruits CARDIAC: RRR, no murmurs, rubs, gallops; radial and PT pulses 2+ bilaterally.  RESPIRATORY:  Clear to auscultation without rales, wheezing or rhonchi  ABDOMEN: Soft, non-tender, non-distended EXTREMITIES:  No edema; No deformity   ASSESSMENT AND PLAN  HTN, goal <130/80 - controlled by in office and home readings Will continue amlodipine  5mg  daily, refills provided Discussed medication safety during family planning, if desired  Cardiology available as needed for hypertension management   Syncope - isolated episodes, suspect related to dehydration and tachycardia and vasovagal; no indication for further workout in absence of palpitations, pre-syncope and dyspnea and no murmurs on exam Continue to monitor for palpitations, lightheadedness, diaphoresis - consider zio patch monitor or echo for recurrence         Dispo: as needed with cardiology  Signed, Reche GORMAN Finder, NP

## 2024-08-02 DIAGNOSIS — Z6826 Body mass index (BMI) 26.0-26.9, adult: Secondary | ICD-10-CM | POA: Diagnosis not present

## 2024-08-02 DIAGNOSIS — Z01419 Encounter for gynecological examination (general) (routine) without abnormal findings: Secondary | ICD-10-CM | POA: Diagnosis not present

## 2024-08-15 DIAGNOSIS — R197 Diarrhea, unspecified: Secondary | ICD-10-CM | POA: Diagnosis not present

## 2024-08-15 DIAGNOSIS — R195 Other fecal abnormalities: Secondary | ICD-10-CM | POA: Diagnosis not present

## 2024-08-15 DIAGNOSIS — R140 Abdominal distension (gaseous): Secondary | ICD-10-CM | POA: Diagnosis not present

## 2024-08-21 DIAGNOSIS — Z8379 Family history of other diseases of the digestive system: Secondary | ICD-10-CM | POA: Diagnosis not present

## 2024-08-21 DIAGNOSIS — R195 Other fecal abnormalities: Secondary | ICD-10-CM | POA: Diagnosis not present

## 2024-08-21 DIAGNOSIS — K648 Other hemorrhoids: Secondary | ICD-10-CM | POA: Diagnosis not present

## 2024-08-21 DIAGNOSIS — K633 Ulcer of intestine: Secondary | ICD-10-CM | POA: Diagnosis not present
